# Patient Record
Sex: Male | Born: 1995 | Race: Black or African American | Hispanic: No | Marital: Single | Smoking: Never smoker
Health system: Southern US, Community
[De-identification: ages and names within clinical notes are randomized; demographics above are authoritative.]

## PROBLEM LIST (undated history)

## (undated) DIAGNOSIS — S8990XA Unspecified injury of unspecified lower leg, initial encounter: Secondary | ICD-10-CM

---
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
## 2014-01-15 ENCOUNTER — Encounter (HOSPITAL_COMMUNITY): Payer: Self-pay | Admitting: Emergency Medicine

## 2014-01-15 ENCOUNTER — Emergency Department (INDEPENDENT_AMBULATORY_CARE_PROVIDER_SITE_OTHER)
Admission: EM | Admit: 2014-01-15 | Discharge: 2014-01-15 | Disposition: A | Discharge status: Home / Self Care | Payer: 59 | Source: Home / Self Care | Attending: Emergency Medicine | Admitting: Emergency Medicine

## 2014-01-15 DIAGNOSIS — H109 Unspecified conjunctivitis: Secondary | ICD-10-CM

## 2014-01-15 HISTORY — DX: Unspecified injury of unspecified lower leg, initial encounter: S89.90XA

## 2014-01-15 IMAGING — CT CT HEAD W/O CM
1 series · 16 of 30 positions shown, 20 images · non-contrast
Comparison: None.

CLINICAL DATA: Severe headache with nausea and vomiting.

CT HEAD WITHOUT CONTRAST
TECHNIQUE: Contiguous axial images were obtained from the base of
the skull through the vertex without contrast.

[Series 2: head routine 4.8 h37s · axial · 0.43mm/px · z∈[-9,+146]mm · 16 of 36 slices shown, 20 images]
[im 2/36  brain]
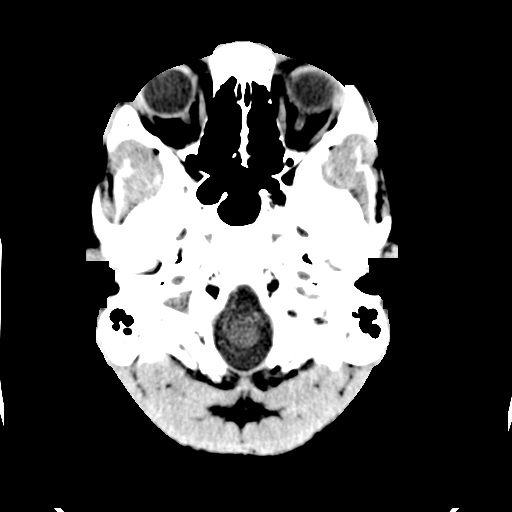
[im 2/36  bone]
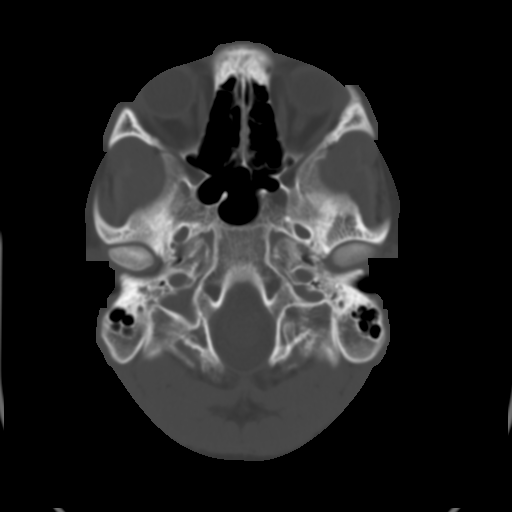
[im 4/36  brain]
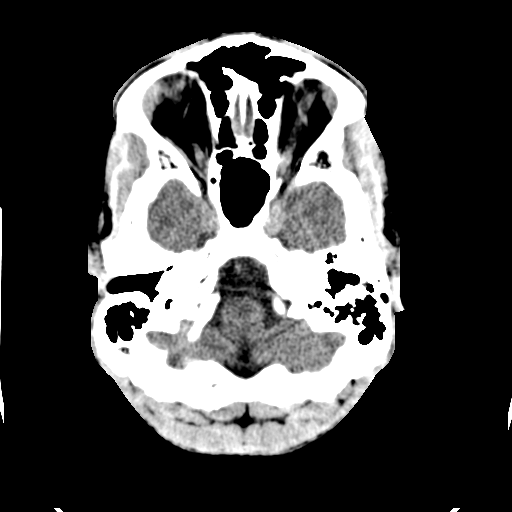
[im 7/36  brain]
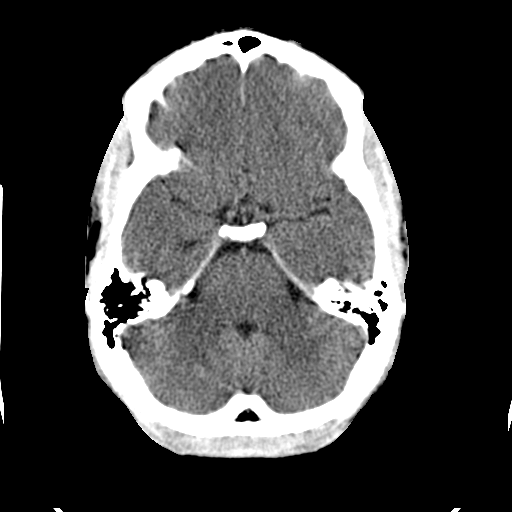
[im 9/36  brain]
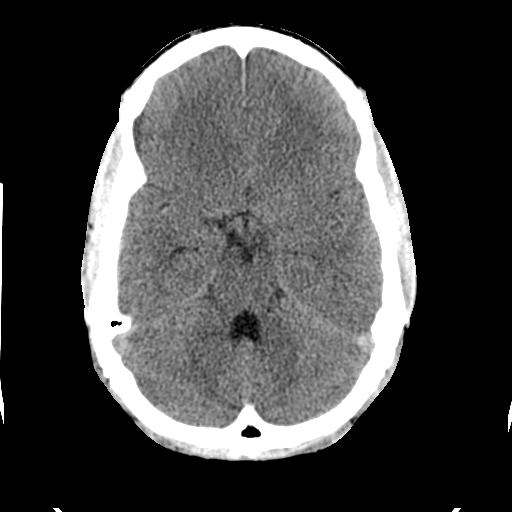
[im 10/36  brain]
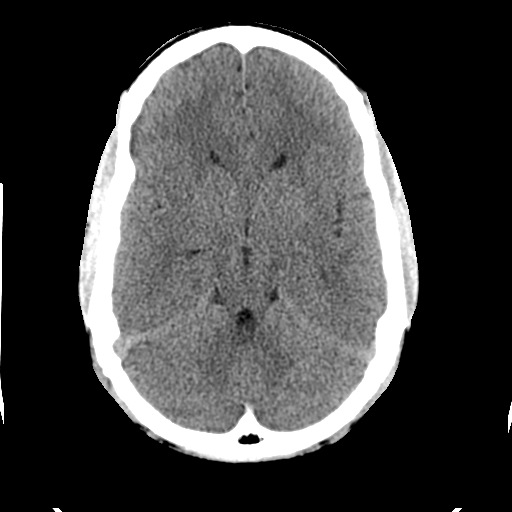
[im 10/36  bone]
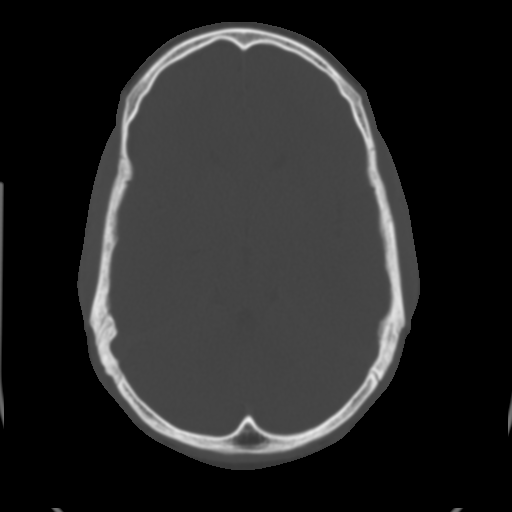
[im 13/36  brain]
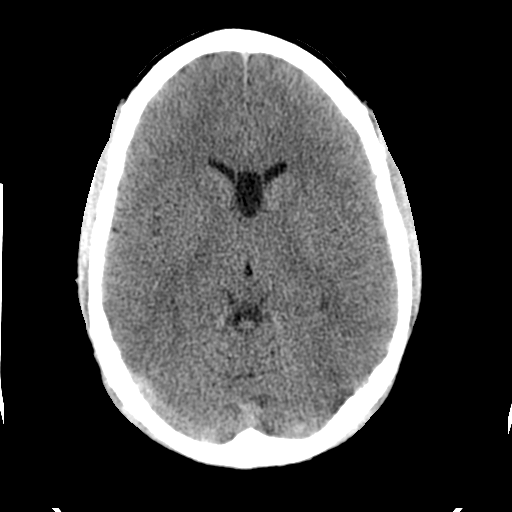
[im 15/36  brain]
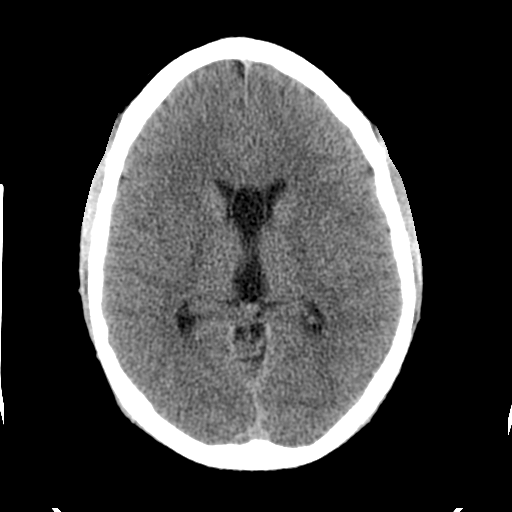
[im 17/36  brain]
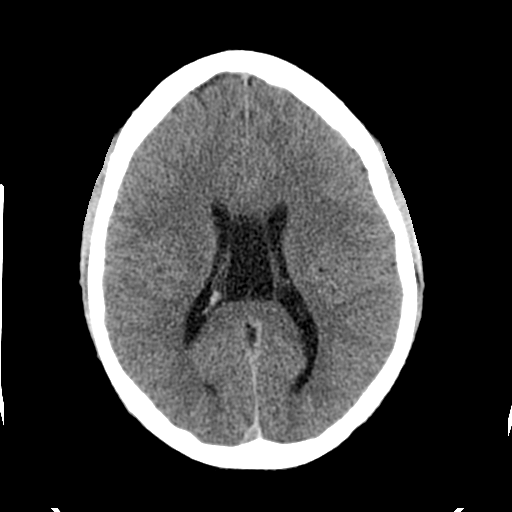
[im 19/36  brain]
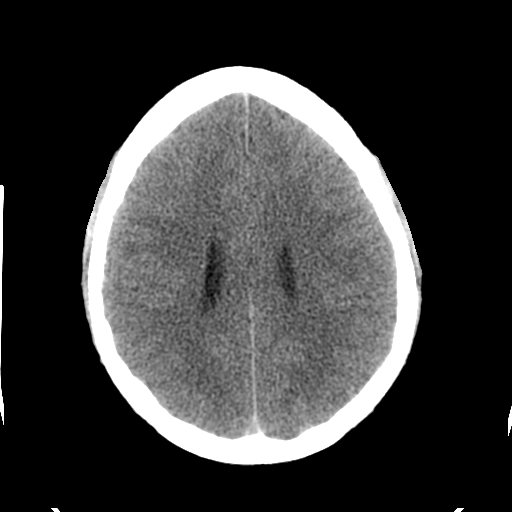
[im 19/36  bone]
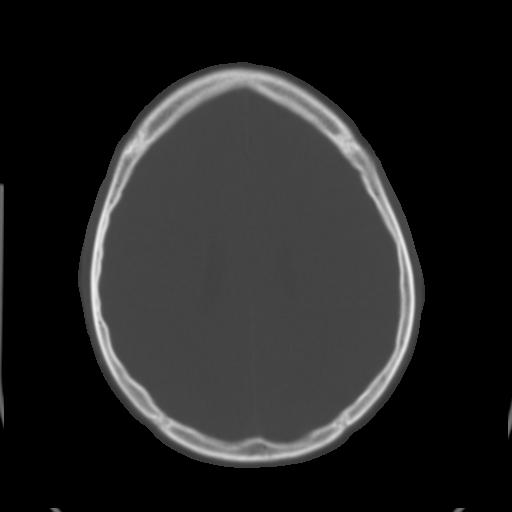
[im 21/36  brain]
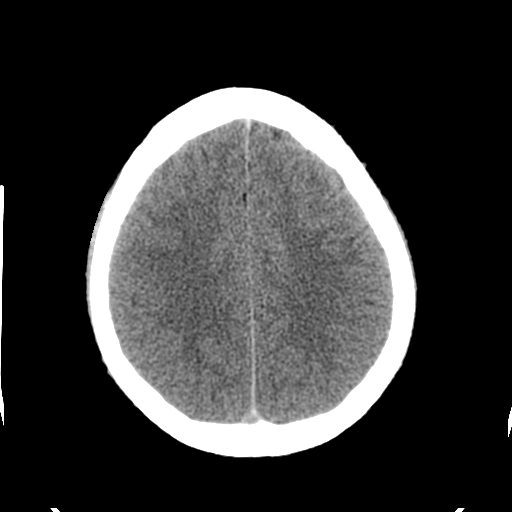
[im 23/36  brain]
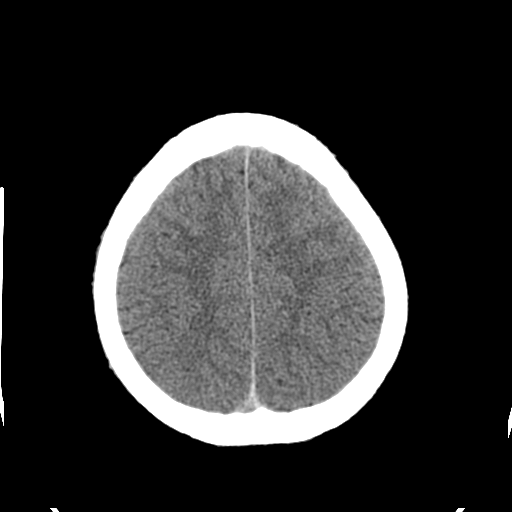
[im 26/36  brain]
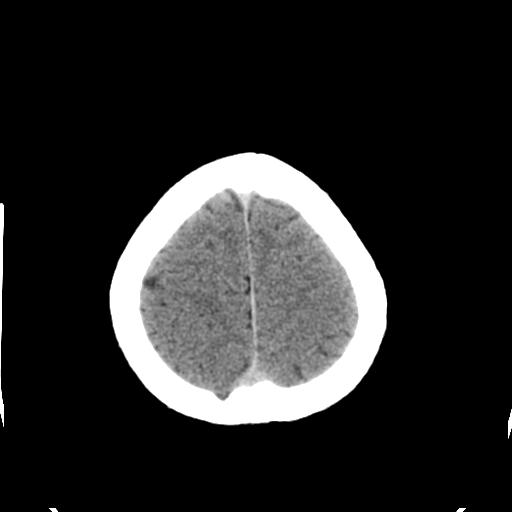
[im 27/36  brain]
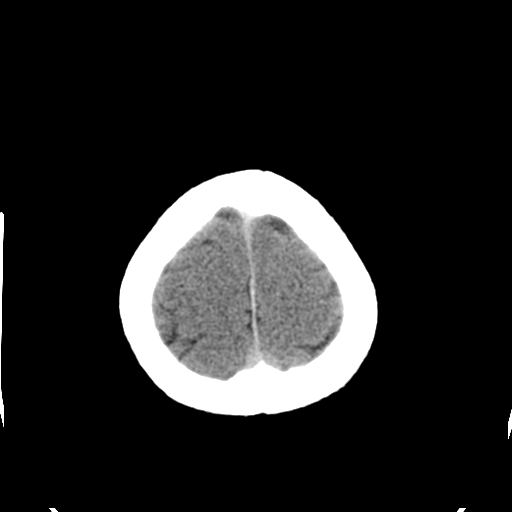
[im 27/36  bone]
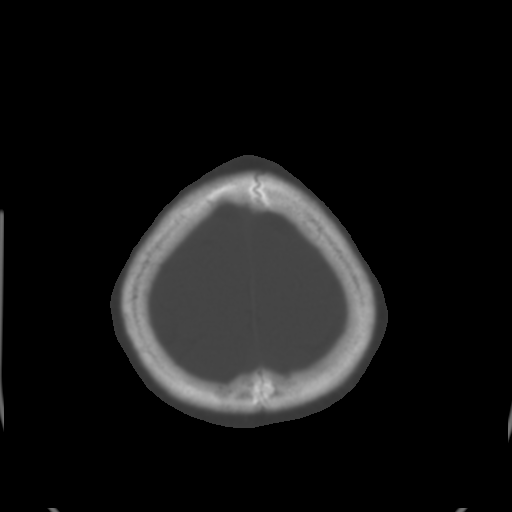
[im 29/36  brain]
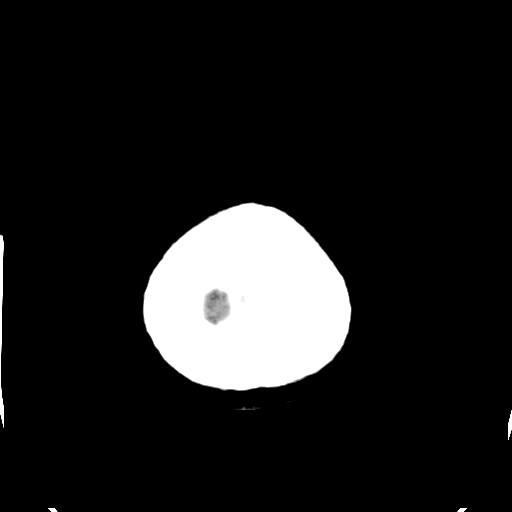
[im 32/36  brain]
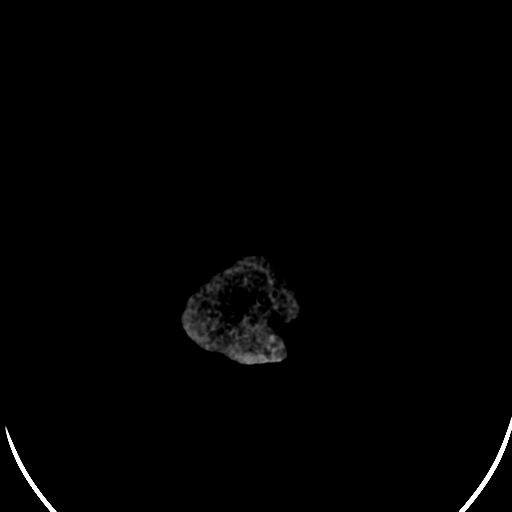
[im 34/36  brain]
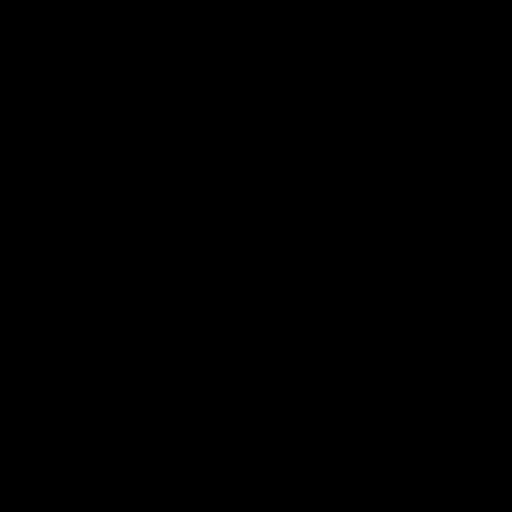

[16 of 30 positions shown; findings below may reference images not displayed]

FINDINGS: The patient has a cavum septum pellucidum and vergae.  No
evidence for acute hemorrhage, mass lesion, midline shift,
hydrocephalus or large infarct.  The visualized paranasal sinuses
are clear.  No acute bony abnormality.
IMPRESSION: No acute intracranial abnormality.

## 2014-01-15 MED ORDER — FLUORESCEIN SODIUM 1 MG OP STRP
ORAL_STRIP | OPHTHALMIC | Status: AC
  Filled 2014-01-15: qty 2

## 2014-01-15 MED ORDER — HYDROCODONE-ACETAMINOPHEN 5-325 MG PO TABS: ORAL_TABLET | ORAL | Status: AC

## 2014-01-15 MED ORDER — KETOROLAC TROMETHAMINE 0.5 % OP SOLN: 1.0000 [drp] | Freq: Four times a day (QID) | OPHTHALMIC | Status: AC

## 2014-01-15 MED ORDER — TETRACAINE HCL 0.5 % OP SOLN
OPHTHALMIC | Status: AC
  Filled 2014-01-15: qty 2

## 2014-01-15 MED ORDER — MOXIFLOXACIN HCL 0.5 % OP SOLN: 1.0000 [drp] | Freq: Three times a day (TID) | OPHTHALMIC | Status: AC

## 2014-01-15 NOTE — Discharge Instructions (Signed)

## 2014-01-15 NOTE — ED Notes (Signed)
C/o irritation to both eyes onset Thur.  Started in R eye and moved to the L eye. Has a little drainage to R eye.

## 2014-01-15 NOTE — ED Provider Notes (Signed)
  Chief Complaint   Chief Complaint  Patient presents with  . Eye Problem    History of Present Illness   Dustin Hughes is a 18 year old male who has had a four-day history of redness and pain that began in the right eye and then spread to the left eye. The eye has been tearing, but his vision is normal. He denies any trauma or use of contact lenses. There's been no yellow or green drainage. He has had some nasal congestion and cough. He denies any sore throat, adenopathy, fever, headache, or stiff neck. No sick exposures.  Review of Systems   Other than as noted above, the patient denies any of the following symptoms: Systemic:  No fever, chills, or headache. Eye:  No blurred vision, or diplopia. ENT:  No nasal congestion, rhinorrhea, or sore throat. Lymphatic:  No adenopathy. Skin:  No rash or pruritis.  PMFSH   Past medical history, family history, social history, meds, and allergies were reviewed.    Physical Examination    Vital signs:  BP 109/66  Pulse 80  Temp(Src) 98.9 F (37.2 C) (Oral)  Resp 16  SpO2 98% General:  Alert and in no distress. Eye:  His lids were mildly swollen. Conjunctivas are markedly injected, right more so than left. There is no foreign body. There is some yellowish drainage in the right conjunctival sac. Corneas are intact to fluorescein staining, anterior chambers are normal, PERRLA, full EOMs, fundi are benign. ENT:  TMs and canals clear.  Nasal mucosa normal.  No intra-oral lesions, mucous membranes moist, pharynx clear. Neck:  No adenopathy tenderness or mass. Skin:  Clear, warm and dry.  Assessment   The encounter diagnosis was Bilateral conjunctivitis.  Plan     1.  Meds:  The following meds were prescribed:   Discharge Medication List as of 01/15/2014  6:18 PM    START taking these medications   Details  HYDROcodone-acetaminophen (NORCO/VICODIN) 5-325 MG per tablet 1 to 2 tabs every 4 to 6 hours as needed for pain., Print     ketorolac (ACULAR) 0.5 % ophthalmic solution Place 1 drop into both eyes every 6 (six) hours., Starting 01/15/2014, Until Discontinued, Normal    moxifloxacin (VIGAMOX) 0.5 % ophthalmic solution Place 1 drop into both eyes 3 (three) times daily., Starting 01/15/2014, Until Discontinued, Normal        2.  Patient Education/Counseling:  The patient was given appropriate handouts, self care instructions, and instructed in symptomatic relief.    3.  Follow up:  The patient was told to follow up here if no better in 3 to 4 days, or sooner if becoming worse in any way, and given some red flag symptoms such as increasing pain or changes in vision which would prompt immediate return.  Follow up here as needed.      Reuben Likes, MD 01/15/14 [phone]
